# Patient Record
Sex: Female | Born: 1940 | Race: White | Hispanic: No | Smoking: Never smoker
Health system: Southern US, Community
[De-identification: ages and names within clinical notes are randomized; demographics above are authoritative.]

## PROBLEM LIST (undated history)

## (undated) DIAGNOSIS — I10 Essential (primary) hypertension: Secondary | ICD-10-CM

---

## 2008-07-21 ENCOUNTER — Emergency Department (HOSPITAL_COMMUNITY): Admission: EM | Admit: 2008-07-21 | Discharge: 2008-07-21 | Payer: Self-pay | Admitting: Emergency Medicine

## 2008-09-27 ENCOUNTER — Emergency Department (HOSPITAL_COMMUNITY): Admission: EM | Admit: 2008-09-27 | Discharge: 2008-09-27 | Payer: Self-pay | Admitting: Emergency Medicine

## 2009-03-01 ENCOUNTER — Emergency Department (HOSPITAL_COMMUNITY): Admission: EM | Admit: 2009-03-01 | Discharge: 2009-03-01 | Payer: Self-pay | Admitting: Emergency Medicine

## 2010-02-03 ENCOUNTER — Emergency Department (HOSPITAL_COMMUNITY): Admission: EM | Admit: 2010-02-03 | Discharge: 2010-02-04 | Payer: Self-pay | Admitting: Emergency Medicine

## 2010-06-14 ENCOUNTER — Ambulatory Visit (HOSPITAL_COMMUNITY): Admission: RE | Admit: 2010-06-14 | Discharge: 2010-06-14 | Payer: Self-pay | Admitting: Internal Medicine

## 2010-06-21 ENCOUNTER — Encounter: Payer: Self-pay | Admitting: Gastroenterology

## 2010-06-27 ENCOUNTER — Ambulatory Visit (HOSPITAL_COMMUNITY): Admission: RE | Admit: 2010-06-27 | Discharge: 2010-06-27 | Payer: Self-pay | Admitting: Gastroenterology

## 2010-06-27 ENCOUNTER — Ambulatory Visit: Payer: Self-pay | Admitting: Gastroenterology

## 2010-06-29 ENCOUNTER — Encounter (INDEPENDENT_AMBULATORY_CARE_PROVIDER_SITE_OTHER): Payer: Self-pay

## 2010-10-11 NOTE — Letter (Signed)
Summary: TRIAGE ORDER  TRIAGE ORDER   Imported By: Ave Filter 06/21/2010 16:19:59  _____________________________________________________________________  External Attachment:    Type:   Image     Comment:   External Document

## 2010-10-11 NOTE — Letter (Signed)
Summary: Plan of Care, Need to [facility]  [address]   [place], [place] [address]   Phone: [phone]  Fax: [phone]    June 29, 2010  Monica Alvarez [address] [place], [place]  [address] Nov 04, 1940   Dear Monica Alvarez,   We are writing this letter to inform you of treatment plans and/or discuss your plan of care.  We have tried several times to contact you; however, we have yet to reach you.  We ask that you please contact our office for follow-up on your gastrointestinal issues.  We can  be reached at [phone] to schedule an appointment, or to speak with someone regarding your health care needs.  Please do not neglect your health.   Sincerely,    Cloria Spring LPN  [facility] Ph: [phone]    Fax: [phone]

## 2010-11-28 LAB — DIFFERENTIAL
Basophils Relative: 1 % (ref 0–1)
Eosinophils Relative: 1 % (ref 0–5)
Monocytes Absolute: 0.6 10*3/uL (ref 0.1–1.0)
Monocytes Relative: 8 % (ref 3–12)
Neutro Abs: 3.1 10*3/uL (ref 1.7–7.7)

## 2010-11-28 LAB — BASIC METABOLIC PANEL
CO2: 24 mEq/L (ref 19–32)
Chloride: 110 mEq/L (ref 96–112)
GFR calc Af Amer: >60 mL/min (ref >60)
Glucose, Bld: 95 mg/dL (ref 70–99)
Potassium: 3.7 mEq/L (ref 3.5–5.1)
Sodium: 140 mEq/L (ref 135–145)

## 2010-11-28 LAB — CBC
HCT: 40.3 % (ref 36.0–46.0)
Hemoglobin: 13.9 g/dL (ref 12.0–15.0)
MCHC: 34.5 g/dL (ref 30.0–36.0)
MCV: 92.8 fL (ref 78.0–100.0)
RBC: 4.35 MIL/uL (ref 3.87–5.11)
RDW: 12.3 % (ref 11.5–15.5)

## 2011-06-13 LAB — STREP A DNA PROBE

## 2013-06-17 ENCOUNTER — Other Ambulatory Visit (HOSPITAL_COMMUNITY): Payer: Self-pay | Admitting: Family Medicine

## 2013-06-17 DIAGNOSIS — Z139 Encounter for screening, unspecified: Secondary | ICD-10-CM

## 2013-07-01 ENCOUNTER — Ambulatory Visit (HOSPITAL_COMMUNITY)
Admission: RE | Admit: 2013-07-01 | Discharge: 2013-07-01 | Disposition: A | Discharge status: Home / Self Care | Payer: Medicare Other | Source: Ambulatory Visit | Attending: Family Medicine | Admitting: Family Medicine

## 2013-07-01 DIAGNOSIS — Z139 Encounter for screening, unspecified: Secondary | ICD-10-CM

## 2013-07-01 DIAGNOSIS — Z1231 Encounter for screening mammogram for malignant neoplasm of breast: Secondary | ICD-10-CM | POA: Insufficient documentation

## 2015-07-19 ENCOUNTER — Emergency Department (HOSPITAL_COMMUNITY)
Admission: EM | Admit: 2015-07-19 | Discharge: 2015-07-19 | Disposition: A | Discharge status: Home / Self Care | Payer: Medicare Other | Attending: Emergency Medicine | Admitting: Emergency Medicine

## 2015-07-19 ENCOUNTER — Encounter (HOSPITAL_COMMUNITY): Payer: Self-pay | Admitting: Emergency Medicine

## 2015-07-19 ENCOUNTER — Emergency Department (HOSPITAL_COMMUNITY): Payer: Medicare Other

## 2015-07-19 DIAGNOSIS — I10 Essential (primary) hypertension: Secondary | ICD-10-CM | POA: Diagnosis not present

## 2015-07-19 DIAGNOSIS — R42 Dizziness and giddiness: Secondary | ICD-10-CM | POA: Insufficient documentation

## 2015-07-19 DIAGNOSIS — J209 Acute bronchitis, unspecified: Secondary | ICD-10-CM | POA: Insufficient documentation

## 2015-07-19 DIAGNOSIS — R05 Cough: Secondary | ICD-10-CM | POA: Diagnosis present

## 2015-07-19 DIAGNOSIS — J40 Bronchitis, not specified as acute or chronic: Secondary | ICD-10-CM

## 2015-07-19 HISTORY — DX: Essential (primary) hypertension: I10

## 2015-07-19 LAB — CBC WITH DIFFERENTIAL/PLATELET
Basophils Absolute: 0 10*3/uL (ref 0.0–0.1)
Basophils Relative: 0 %
EOS PCT: 0 %
Eosinophils Absolute: 0 10*3/uL (ref 0.0–0.7)
HEMATOCRIT: 37.4 % (ref 36.0–46.0)
Hemoglobin: 12.9 g/dL (ref 12.0–15.0)
LYMPHS ABS: 0.7 10*3/uL (ref 0.7–4.0)
LYMPHS PCT: 8 %
MCH: 31.9 pg (ref 26.0–34.0)
MCHC: 34.5 g/dL (ref 30.0–36.0)
MCV: 92.3 fL (ref 78.0–100.0)
MONO ABS: 0.6 10*3/uL (ref 0.1–1.0)
Monocytes Relative: 7 %
NEUTROS ABS: 7.7 10*3/uL (ref 1.7–7.7)
Neutrophils Relative %: 85 %
PLATELETS: 157 10*3/uL (ref 150–400)
RBC: 4.05 MIL/uL (ref 3.87–5.11)
RDW: 12.4 % (ref 11.5–15.5)
WBC: 9 10*3/uL (ref 4.0–10.5)

## 2015-07-19 LAB — COMPREHENSIVE METABOLIC PANEL
ALK PHOS: 71 U/L (ref 38–126)
ALT: 21 U/L (ref 14–54)
ANION GAP: 10 (ref 5–15)
AST: 26 U/L (ref 15–41)
Albumin: 3.8 g/dL (ref 3.5–5.0)
BUN: 16 mg/dL (ref 6–20)
CALCIUM: 8.9 mg/dL (ref 8.9–10.3)
CO2: 23 mmol/L (ref 22–32)
Chloride: 103 mmol/L (ref 101–111)
Creatinine, Ser: 0.81 mg/dL (ref 0.44–1.00)
GFR calc Af Amer: >60 mL/min (ref >60)
GFR calc non Af Amer: >60 mL/min (ref >60)
GLUCOSE: 148 mg/dL — AB (ref 65–99)
POTASSIUM: 3.3 mmol/L — AB (ref 3.5–5.1)
Sodium: 136 mmol/L (ref 135–145)
Total Bilirubin: 0.8 mg/dL (ref 0.3–1.2)
Total Protein: 7.2 g/dL (ref 6.5–8.1)

## 2015-07-19 MED ORDER — DOXYCYCLINE HYCLATE 100 MG PO CAPS: 100.0000 mg | ORAL_CAPSULE | Freq: Two times a day (BID) | ORAL | Status: AC

## 2015-07-19 MED ORDER — ACETAMINOPHEN 500 MG PO TABS
1000.0000 mg | ORAL_TABLET | Freq: Once | ORAL | Status: AC
  Administered 2015-07-19: 1000 mg via ORAL
  Filled 2015-07-19: qty 2

## 2015-07-19 MED ORDER — GUAIFENESIN 100 MG/5ML PO LIQD: 100.0000 mg | ORAL | Status: AC | PRN

## 2015-07-19 MED ORDER — SODIUM CHLORIDE 0.9 % IV BOLUS (SEPSIS)
1000.0000 mL | Freq: Once | INTRAVENOUS | Status: AC
  Administered 2015-07-19: 1000 mL via INTRAVENOUS

## 2015-07-19 NOTE — ED Provider Notes (Signed)
CSN: [id number]     Arrival date & time 07/19/15  0807 History  By signing my name below, I, Soijett Blue, attest that this documentation has been prepared under the direction and in the presence of Bethann Berkshire, MD. Electronically Signed: Soijett Blue, ED Scribe. 07/19/2015. 8:30 AM.   Chief Complaint  Patient presents with  . Shortness of Breath  . Cough      Patient is a 74 y.o. female presenting with shortness of breath and cough. The history is provided by the patient (the patient). No language interpreter was used.  Shortness of Breath Severity:  Moderate Onset quality:  Sudden Duration:  5 days Timing:  Intermittent Progression:  Worsening Chronicity:  New Relieved by:  Nothing Worsened by:  Nothing tried Ineffective treatments:  None tried Associated symptoms: cough, fever and headaches   Associated symptoms: no abdominal pain, no chest pain and no rash   Risk factors: no tobacco use   Cough Associated symptoms: chills, fever, headaches and shortness of breath   Associated symptoms: no chest pain, no eye discharge and no rash     LEIGH BLAS is a 74 y.o. female with a medical hx of HTN who presents to the Emergency Department complaining of SOB onset 4-5 days. Pt is having intermittent associated symptoms of non-productive cough, HA, lightheaded, fever, chills, and appetite change. She denies dizziness, and any other symptoms. Pt denies smoking at this time. Pt PCP is Dr. Sherryll Burger.    Past Medical History  Diagnosis Date  . Hypertension    History reviewed. No pertinent past surgical history. No family history on file. Social History  Substance Use Topics  . Smoking status: Never Smoker   . Smokeless tobacco: None  . Alcohol Use: No   OB History    No data available     Review of Systems  Constitutional: Positive for fever, chills and appetite change. Negative for fatigue.  HENT: Negative for congestion, ear discharge and sinus pressure.   Eyes: Negative  for discharge.  Respiratory: Positive for cough and shortness of breath.   Cardiovascular: Negative for chest pain.  Gastrointestinal: Negative for abdominal pain and diarrhea.  Genitourinary: Negative for frequency and hematuria.  Musculoskeletal: Negative for back pain.  Skin: Negative for rash.  Neurological: Positive for light-headedness and headaches. Negative for seizures.  Psychiatric/Behavioral: Negative for hallucinations.      Allergies  Review of patient's allergies indicates no known allergies.  Home Medications   Prior to Admission medications   Not on File   BP 120/62 mmHg  Pulse 92  Temp(Src) 99.1 F (37.3 C) (Oral)  Resp 21  Ht  (1.575 m)  Wt 184 lb (83.462 kg)  BMI 33.65 kg/m2  SpO2 92% Physical Exam  Constitutional: She is oriented to person, place, and time. She appears well-developed.  HENT:  Head: Normocephalic.  Eyes: Conjunctivae and EOM are normal. No scleral icterus.  Neck: Neck supple. No thyromegaly present.  Cardiovascular: Normal rate and regular rhythm.  Exam reveals no gallop and no friction rub.   No murmur heard. Pulmonary/Chest: No stridor. She has no wheezes. She has no rales. She exhibits no tenderness.  Abdominal: She exhibits no distension. There is no tenderness. There is no rebound.  Musculoskeletal: Normal range of motion. She exhibits no edema.  Lymphadenopathy:    She has no cervical adenopathy.  Neurological: She is oriented to person, place, and time. She exhibits normal muscle tone. Coordination normal.  Skin: No rash noted. No  erythema.  Psychiatric: She has a normal mood and affect. Her behavior is normal.    ED Course  Procedures (including critical care time) DIAGNOSTIC STUDIES: Oxygen Saturation is 92% on RA, low by my interpretation.    COORDINATION OF CARE: 8:29 AM Discussed treatment plan with pt at bedside which includes EKG, labs, and CXR and pt agreed to plan.    Labs Review Labs Reviewed   COMPREHENSIVE METABOLIC PANEL - Abnormal; Notable for the following:    Potassium 3.3 (*)    Glucose, Bld 148 (*)    All other components within normal limits  CBC WITH DIFFERENTIAL/PLATELET    Imaging Review Dg Chest 2 View  07/19/2015  CLINICAL DATA:  Cough, congestion, shortness of Breath EXAM: CHEST  2 VIEW COMPARISON:  07/21/2008 FINDINGS: Linear areas of scarring in the mid lungs bilaterally. This was present on prior study but has increased. No other confluent airspace opacities. No effusions. Heart is normal size. No acute bony abnormality. IMPRESSION: Areas of scarring bilaterally.  No acute findings. Electronically Signed   By: Charlett NoseKevin  Dover M.D.   On: 07/19/2015 09:33   I have personally reviewed and evaluated these images and lab results as part of my medical decision-making.   EKG Interpretation None      MDM   Final diagnoses:  None    Labs and x-rays unremarkable. Will treat respiratory infection with Levaquin and guaifenesin. Patient's follow-up with her family doctor next week   Bethann BerkshireJoseph Eowyn Tabone, MD 07/19/15 1257

## 2015-07-19 NOTE — ED Notes (Signed)
SOB for last 4-5 days.  Denies any pain.  C/o non-productive cough.

## 2015-07-19 NOTE — Discharge Instructions (Signed)
Follow up with your md next week if not improving °

## 2016-06-11 IMAGING — DX DG CHEST 2V
2 series · 2 of 2 positions shown · non-contrast
Comparison: 07/21/2008

CLINICAL DATA: Cough, congestion, shortness of Breath

EXAM:
CHEST  2 VIEW

[chest pa]
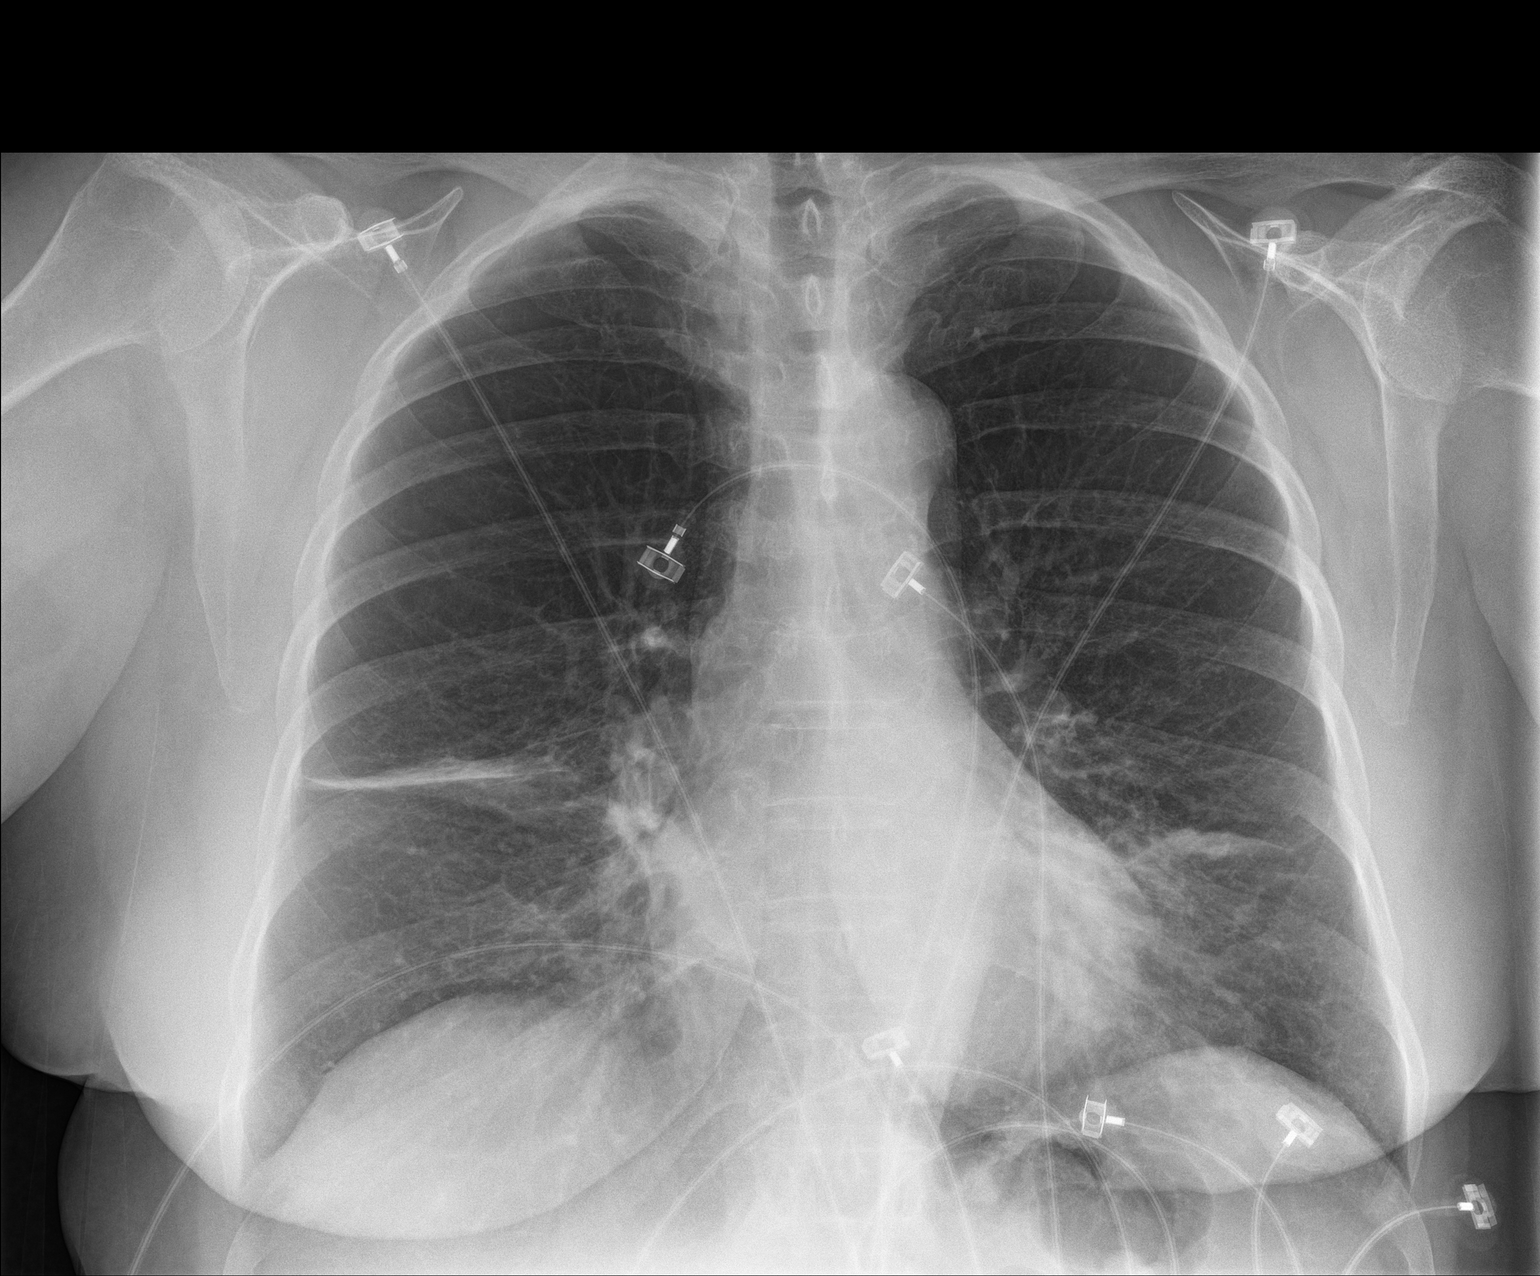

[chest lat]
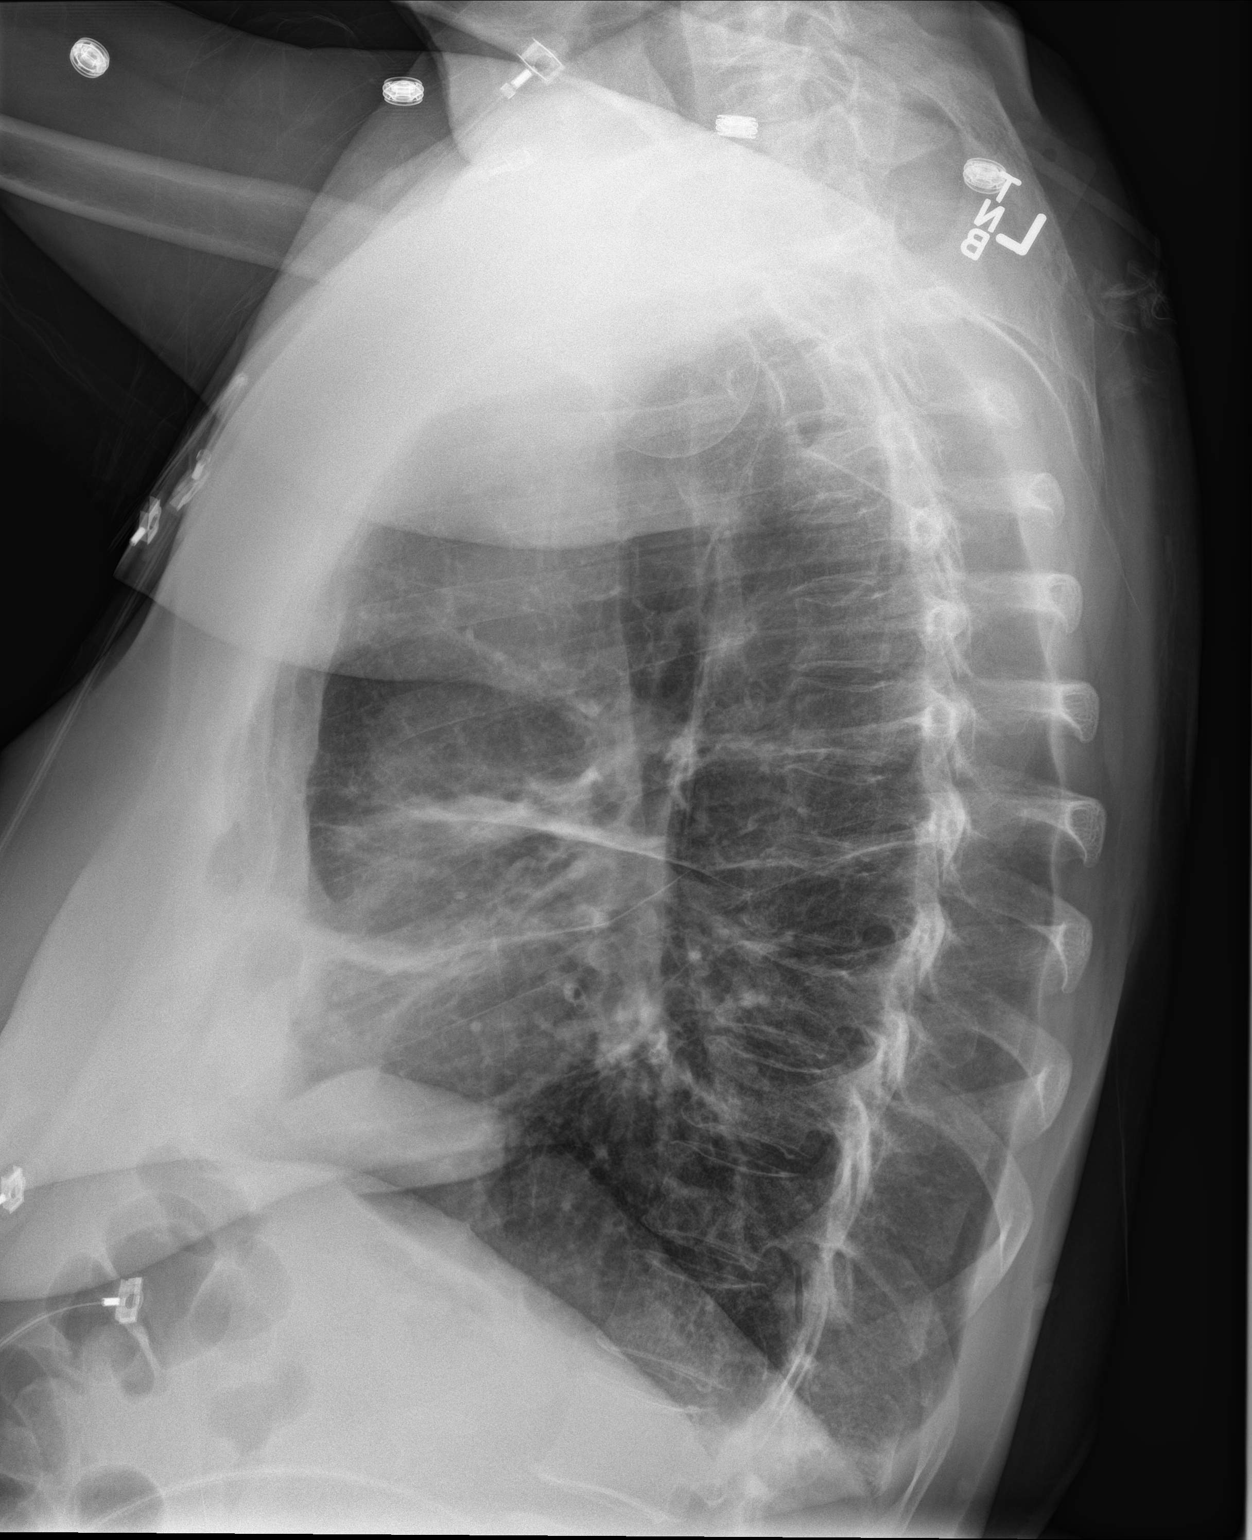

[2 of 2 positions shown; findings below may reference images not displayed]

FINDINGS: Linear areas of scarring in the mid lungs bilaterally. This was
present on prior study but has increased. No other confluent
airspace opacities. No effusions. Heart is normal size. No acute
bony abnormality.
IMPRESSION: Areas of scarring bilaterally.  No acute findings.

## 2019-09-23 DIAGNOSIS — E7800 Pure hypercholesterolemia, unspecified: Secondary | ICD-10-CM | POA: Diagnosis not present

## 2019-09-23 DIAGNOSIS — I10 Essential (primary) hypertension: Secondary | ICD-10-CM | POA: Diagnosis not present

## 2019-10-28 DIAGNOSIS — I10 Essential (primary) hypertension: Secondary | ICD-10-CM | POA: Diagnosis not present

## 2019-10-28 DIAGNOSIS — E7800 Pure hypercholesterolemia, unspecified: Secondary | ICD-10-CM | POA: Diagnosis not present

## 2019-11-18 DIAGNOSIS — I10 Essential (primary) hypertension: Secondary | ICD-10-CM | POA: Diagnosis not present

## 2019-11-18 DIAGNOSIS — E7800 Pure hypercholesterolemia, unspecified: Secondary | ICD-10-CM | POA: Diagnosis not present

## 2019-12-23 DIAGNOSIS — E7800 Pure hypercholesterolemia, unspecified: Secondary | ICD-10-CM | POA: Diagnosis not present

## 2019-12-23 DIAGNOSIS — I10 Essential (primary) hypertension: Secondary | ICD-10-CM | POA: Diagnosis not present

## 2019-12-24 DIAGNOSIS — Z7189 Other specified counseling: Secondary | ICD-10-CM | POA: Diagnosis not present

## 2019-12-24 DIAGNOSIS — R5383 Other fatigue: Secondary | ICD-10-CM | POA: Diagnosis not present

## 2019-12-24 DIAGNOSIS — Z1211 Encounter for screening for malignant neoplasm of colon: Secondary | ICD-10-CM | POA: Diagnosis not present

## 2019-12-24 DIAGNOSIS — E039 Hypothyroidism, unspecified: Secondary | ICD-10-CM | POA: Diagnosis not present

## 2019-12-24 DIAGNOSIS — Z0000 Encounter for general adult medical examination without abnormal findings: Secondary | ICD-10-CM | POA: Diagnosis not present

## 2019-12-24 DIAGNOSIS — Z79899 Other long term (current) drug therapy: Secondary | ICD-10-CM | POA: Diagnosis not present

## 2019-12-24 DIAGNOSIS — I10 Essential (primary) hypertension: Secondary | ICD-10-CM | POA: Diagnosis not present

## 2019-12-24 DIAGNOSIS — E7800 Pure hypercholesterolemia, unspecified: Secondary | ICD-10-CM | POA: Diagnosis not present

## 2019-12-24 DIAGNOSIS — E559 Vitamin D deficiency, unspecified: Secondary | ICD-10-CM | POA: Diagnosis not present

## 2019-12-24 DIAGNOSIS — Z299 Encounter for prophylactic measures, unspecified: Secondary | ICD-10-CM | POA: Diagnosis not present

## 2020-02-08 DIAGNOSIS — E7800 Pure hypercholesterolemia, unspecified: Secondary | ICD-10-CM | POA: Diagnosis not present

## 2020-02-08 DIAGNOSIS — I10 Essential (primary) hypertension: Secondary | ICD-10-CM | POA: Diagnosis not present

## 2020-03-10 DIAGNOSIS — I10 Essential (primary) hypertension: Secondary | ICD-10-CM | POA: Diagnosis not present

## 2020-03-10 DIAGNOSIS — E7800 Pure hypercholesterolemia, unspecified: Secondary | ICD-10-CM | POA: Diagnosis not present

## 2020-04-06 DIAGNOSIS — H524 Presbyopia: Secondary | ICD-10-CM | POA: Diagnosis not present

## 2020-04-09 DIAGNOSIS — R69 Illness, unspecified: Secondary | ICD-10-CM | POA: Diagnosis not present

## 2020-04-09 DIAGNOSIS — E7800 Pure hypercholesterolemia, unspecified: Secondary | ICD-10-CM | POA: Diagnosis not present

## 2020-04-09 DIAGNOSIS — I10 Essential (primary) hypertension: Secondary | ICD-10-CM | POA: Diagnosis not present

## 2020-04-20 DIAGNOSIS — R69 Illness, unspecified: Secondary | ICD-10-CM | POA: Diagnosis not present

## 2020-04-20 DIAGNOSIS — I10 Essential (primary) hypertension: Secondary | ICD-10-CM | POA: Diagnosis not present

## 2020-04-20 DIAGNOSIS — E7800 Pure hypercholesterolemia, unspecified: Secondary | ICD-10-CM | POA: Diagnosis not present

## 2020-05-13 DIAGNOSIS — H524 Presbyopia: Secondary | ICD-10-CM | POA: Diagnosis not present

## 2020-06-08 DIAGNOSIS — Z1231 Encounter for screening mammogram for malignant neoplasm of breast: Secondary | ICD-10-CM | POA: Diagnosis not present

## 2020-06-10 DIAGNOSIS — I10 Essential (primary) hypertension: Secondary | ICD-10-CM | POA: Diagnosis not present

## 2020-06-10 DIAGNOSIS — E7800 Pure hypercholesterolemia, unspecified: Secondary | ICD-10-CM | POA: Diagnosis not present

## 2020-06-10 DIAGNOSIS — R69 Illness, unspecified: Secondary | ICD-10-CM | POA: Diagnosis not present

## 2020-06-16 ENCOUNTER — Encounter: Payer: Self-pay | Admitting: Internal Medicine

## 2020-06-28 DIAGNOSIS — I10 Essential (primary) hypertension: Secondary | ICD-10-CM | POA: Diagnosis not present

## 2020-06-28 DIAGNOSIS — Z23 Encounter for immunization: Secondary | ICD-10-CM | POA: Diagnosis not present

## 2020-06-28 DIAGNOSIS — H9193 Unspecified hearing loss, bilateral: Secondary | ICD-10-CM | POA: Diagnosis not present

## 2020-06-28 DIAGNOSIS — E039 Hypothyroidism, unspecified: Secondary | ICD-10-CM | POA: Diagnosis not present

## 2020-06-28 DIAGNOSIS — Z6835 Body mass index (BMI) 35.0-35.9, adult: Secondary | ICD-10-CM | POA: Diagnosis not present

## 2020-06-28 DIAGNOSIS — Z299 Encounter for prophylactic measures, unspecified: Secondary | ICD-10-CM | POA: Diagnosis not present

## 2020-07-09 DIAGNOSIS — I10 Essential (primary) hypertension: Secondary | ICD-10-CM | POA: Diagnosis not present

## 2020-07-09 DIAGNOSIS — R69 Illness, unspecified: Secondary | ICD-10-CM | POA: Diagnosis not present

## 2020-07-09 DIAGNOSIS — E7800 Pure hypercholesterolemia, unspecified: Secondary | ICD-10-CM | POA: Diagnosis not present

## 2020-08-10 DIAGNOSIS — I10 Essential (primary) hypertension: Secondary | ICD-10-CM | POA: Diagnosis not present

## 2020-08-10 DIAGNOSIS — R69 Illness, unspecified: Secondary | ICD-10-CM | POA: Diagnosis not present

## 2020-08-10 DIAGNOSIS — E7800 Pure hypercholesterolemia, unspecified: Secondary | ICD-10-CM | POA: Diagnosis not present

## 2020-09-09 DIAGNOSIS — R69 Illness, unspecified: Secondary | ICD-10-CM | POA: Diagnosis not present

## 2020-09-09 DIAGNOSIS — E7800 Pure hypercholesterolemia, unspecified: Secondary | ICD-10-CM | POA: Diagnosis not present

## 2020-09-09 DIAGNOSIS — I10 Essential (primary) hypertension: Secondary | ICD-10-CM | POA: Diagnosis not present

## 2020-10-04 DIAGNOSIS — E039 Hypothyroidism, unspecified: Secondary | ICD-10-CM | POA: Diagnosis not present

## 2020-10-04 DIAGNOSIS — Z87891 Personal history of nicotine dependence: Secondary | ICD-10-CM | POA: Diagnosis not present

## 2020-10-04 DIAGNOSIS — I10 Essential (primary) hypertension: Secondary | ICD-10-CM | POA: Diagnosis not present

## 2020-10-04 DIAGNOSIS — Z6834 Body mass index (BMI) 34.0-34.9, adult: Secondary | ICD-10-CM | POA: Diagnosis not present

## 2020-10-04 DIAGNOSIS — Z299 Encounter for prophylactic measures, unspecified: Secondary | ICD-10-CM | POA: Diagnosis not present

## 2020-12-06 DIAGNOSIS — I10 Essential (primary) hypertension: Secondary | ICD-10-CM | POA: Diagnosis not present

## 2020-12-06 DIAGNOSIS — Z87891 Personal history of nicotine dependence: Secondary | ICD-10-CM | POA: Diagnosis not present

## 2020-12-06 DIAGNOSIS — F419 Anxiety disorder, unspecified: Secondary | ICD-10-CM | POA: Diagnosis not present

## 2020-12-06 DIAGNOSIS — G4700 Insomnia, unspecified: Secondary | ICD-10-CM | POA: Diagnosis not present

## 2020-12-06 DIAGNOSIS — E669 Obesity, unspecified: Secondary | ICD-10-CM | POA: Diagnosis not present

## 2020-12-06 DIAGNOSIS — E039 Hypothyroidism, unspecified: Secondary | ICD-10-CM | POA: Diagnosis not present

## 2020-12-06 DIAGNOSIS — Z008 Encounter for other general examination: Secondary | ICD-10-CM | POA: Diagnosis not present

## 2020-12-06 DIAGNOSIS — R69 Illness, unspecified: Secondary | ICD-10-CM | POA: Diagnosis not present

## 2020-12-06 DIAGNOSIS — R32 Unspecified urinary incontinence: Secondary | ICD-10-CM | POA: Diagnosis not present

## 2020-12-06 DIAGNOSIS — E785 Hyperlipidemia, unspecified: Secondary | ICD-10-CM | POA: Diagnosis not present

## 2020-12-06 DIAGNOSIS — Z6834 Body mass index (BMI) 34.0-34.9, adult: Secondary | ICD-10-CM | POA: Diagnosis not present

## 2020-12-30 DIAGNOSIS — Z1339 Encounter for screening examination for other mental health and behavioral disorders: Secondary | ICD-10-CM | POA: Diagnosis not present

## 2020-12-30 DIAGNOSIS — Z299 Encounter for prophylactic measures, unspecified: Secondary | ICD-10-CM | POA: Diagnosis not present

## 2020-12-30 DIAGNOSIS — Z6834 Body mass index (BMI) 34.0-34.9, adult: Secondary | ICD-10-CM | POA: Diagnosis not present

## 2020-12-30 DIAGNOSIS — Z0000 Encounter for general adult medical examination without abnormal findings: Secondary | ICD-10-CM | POA: Diagnosis not present

## 2020-12-30 DIAGNOSIS — Z79899 Other long term (current) drug therapy: Secondary | ICD-10-CM | POA: Diagnosis not present

## 2020-12-30 DIAGNOSIS — E7800 Pure hypercholesterolemia, unspecified: Secondary | ICD-10-CM | POA: Diagnosis not present

## 2020-12-30 DIAGNOSIS — Z1331 Encounter for screening for depression: Secondary | ICD-10-CM | POA: Diagnosis not present

## 2020-12-30 DIAGNOSIS — R5383 Other fatigue: Secondary | ICD-10-CM | POA: Diagnosis not present

## 2020-12-30 DIAGNOSIS — E559 Vitamin D deficiency, unspecified: Secondary | ICD-10-CM | POA: Diagnosis not present

## 2020-12-30 DIAGNOSIS — I10 Essential (primary) hypertension: Secondary | ICD-10-CM | POA: Diagnosis not present

## 2020-12-30 DIAGNOSIS — Z7189 Other specified counseling: Secondary | ICD-10-CM | POA: Diagnosis not present

## 2020-12-30 DIAGNOSIS — E039 Hypothyroidism, unspecified: Secondary | ICD-10-CM | POA: Diagnosis not present

## 2021-01-10 DIAGNOSIS — Z79899 Other long term (current) drug therapy: Secondary | ICD-10-CM | POA: Diagnosis not present

## 2021-01-10 DIAGNOSIS — E213 Hyperparathyroidism, unspecified: Secondary | ICD-10-CM | POA: Diagnosis not present

## 2021-01-10 DIAGNOSIS — E2839 Other primary ovarian failure: Secondary | ICD-10-CM | POA: Diagnosis not present

## 2021-04-05 DIAGNOSIS — Z6834 Body mass index (BMI) 34.0-34.9, adult: Secondary | ICD-10-CM | POA: Diagnosis not present

## 2021-04-05 DIAGNOSIS — I10 Essential (primary) hypertension: Secondary | ICD-10-CM | POA: Diagnosis not present

## 2021-04-05 DIAGNOSIS — Z299 Encounter for prophylactic measures, unspecified: Secondary | ICD-10-CM | POA: Diagnosis not present

## 2021-04-05 DIAGNOSIS — Z713 Dietary counseling and surveillance: Secondary | ICD-10-CM | POA: Diagnosis not present

## 2021-05-11 DIAGNOSIS — E7800 Pure hypercholesterolemia, unspecified: Secondary | ICD-10-CM | POA: Diagnosis not present

## 2021-05-11 DIAGNOSIS — I10 Essential (primary) hypertension: Secondary | ICD-10-CM | POA: Diagnosis not present

## 2021-07-13 DIAGNOSIS — I10 Essential (primary) hypertension: Secondary | ICD-10-CM | POA: Diagnosis not present

## 2021-07-13 DIAGNOSIS — Z6833 Body mass index (BMI) 33.0-33.9, adult: Secondary | ICD-10-CM | POA: Diagnosis not present

## 2021-07-13 DIAGNOSIS — Z713 Dietary counseling and surveillance: Secondary | ICD-10-CM | POA: Diagnosis not present

## 2021-07-13 DIAGNOSIS — Z23 Encounter for immunization: Secondary | ICD-10-CM | POA: Diagnosis not present

## 2021-07-13 DIAGNOSIS — Z87891 Personal history of nicotine dependence: Secondary | ICD-10-CM | POA: Diagnosis not present

## 2021-07-13 DIAGNOSIS — R253 Fasciculation: Secondary | ICD-10-CM | POA: Diagnosis not present

## 2021-07-13 DIAGNOSIS — Z299 Encounter for prophylactic measures, unspecified: Secondary | ICD-10-CM | POA: Diagnosis not present

## 2021-11-08 DIAGNOSIS — E7800 Pure hypercholesterolemia, unspecified: Secondary | ICD-10-CM | POA: Diagnosis not present

## 2021-11-08 DIAGNOSIS — I10 Essential (primary) hypertension: Secondary | ICD-10-CM | POA: Diagnosis not present

## 2021-12-26 DIAGNOSIS — E039 Hypothyroidism, unspecified: Secondary | ICD-10-CM | POA: Diagnosis not present

## 2021-12-26 DIAGNOSIS — G4700 Insomnia, unspecified: Secondary | ICD-10-CM | POA: Diagnosis not present

## 2021-12-26 DIAGNOSIS — Z6833 Body mass index (BMI) 33.0-33.9, adult: Secondary | ICD-10-CM | POA: Diagnosis not present

## 2021-12-26 DIAGNOSIS — E669 Obesity, unspecified: Secondary | ICD-10-CM | POA: Diagnosis not present

## 2021-12-26 DIAGNOSIS — Z811 Family history of alcohol abuse and dependence: Secondary | ICD-10-CM | POA: Diagnosis not present

## 2021-12-26 DIAGNOSIS — F419 Anxiety disorder, unspecified: Secondary | ICD-10-CM | POA: Diagnosis not present

## 2021-12-26 DIAGNOSIS — Z008 Encounter for other general examination: Secondary | ICD-10-CM | POA: Diagnosis not present

## 2021-12-26 DIAGNOSIS — R32 Unspecified urinary incontinence: Secondary | ICD-10-CM | POA: Diagnosis not present

## 2021-12-26 DIAGNOSIS — E785 Hyperlipidemia, unspecified: Secondary | ICD-10-CM | POA: Diagnosis not present

## 2021-12-26 DIAGNOSIS — Z809 Family history of malignant neoplasm, unspecified: Secondary | ICD-10-CM | POA: Diagnosis not present

## 2021-12-26 DIAGNOSIS — G4733 Obstructive sleep apnea (adult) (pediatric): Secondary | ICD-10-CM | POA: Diagnosis not present

## 2021-12-26 DIAGNOSIS — R69 Illness, unspecified: Secondary | ICD-10-CM | POA: Diagnosis not present

## 2021-12-26 DIAGNOSIS — I10 Essential (primary) hypertension: Secondary | ICD-10-CM | POA: Diagnosis not present

## 2022-01-05 DIAGNOSIS — Z1331 Encounter for screening for depression: Secondary | ICD-10-CM | POA: Diagnosis not present

## 2022-01-05 DIAGNOSIS — Z1339 Encounter for screening examination for other mental health and behavioral disorders: Secondary | ICD-10-CM | POA: Diagnosis not present

## 2022-01-05 DIAGNOSIS — E039 Hypothyroidism, unspecified: Secondary | ICD-10-CM | POA: Diagnosis not present

## 2022-01-05 DIAGNOSIS — E7800 Pure hypercholesterolemia, unspecified: Secondary | ICD-10-CM | POA: Diagnosis not present

## 2022-01-05 DIAGNOSIS — I10 Essential (primary) hypertension: Secondary | ICD-10-CM | POA: Diagnosis not present

## 2022-01-05 DIAGNOSIS — Z7189 Other specified counseling: Secondary | ICD-10-CM | POA: Diagnosis not present

## 2022-01-05 DIAGNOSIS — Z79899 Other long term (current) drug therapy: Secondary | ICD-10-CM | POA: Diagnosis not present

## 2022-01-05 DIAGNOSIS — Z299 Encounter for prophylactic measures, unspecified: Secondary | ICD-10-CM | POA: Diagnosis not present

## 2022-01-05 DIAGNOSIS — Z6833 Body mass index (BMI) 33.0-33.9, adult: Secondary | ICD-10-CM | POA: Diagnosis not present

## 2022-01-05 DIAGNOSIS — R5383 Other fatigue: Secondary | ICD-10-CM | POA: Diagnosis not present

## 2022-01-05 DIAGNOSIS — E559 Vitamin D deficiency, unspecified: Secondary | ICD-10-CM | POA: Diagnosis not present

## 2022-01-05 DIAGNOSIS — Z0000 Encounter for general adult medical examination without abnormal findings: Secondary | ICD-10-CM | POA: Diagnosis not present

## 2022-04-10 DIAGNOSIS — Z0000 Encounter for general adult medical examination without abnormal findings: Secondary | ICD-10-CM | POA: Diagnosis not present

## 2022-04-10 DIAGNOSIS — I10 Essential (primary) hypertension: Secondary | ICD-10-CM | POA: Diagnosis not present

## 2022-04-10 DIAGNOSIS — Z299 Encounter for prophylactic measures, unspecified: Secondary | ICD-10-CM | POA: Diagnosis not present

## 2022-04-10 DIAGNOSIS — Z87891 Personal history of nicotine dependence: Secondary | ICD-10-CM | POA: Diagnosis not present

## 2022-04-10 DIAGNOSIS — Z6834 Body mass index (BMI) 34.0-34.9, adult: Secondary | ICD-10-CM | POA: Diagnosis not present

## 2022-04-10 DIAGNOSIS — E039 Hypothyroidism, unspecified: Secondary | ICD-10-CM | POA: Diagnosis not present

## 2022-10-10 DIAGNOSIS — Z87891 Personal history of nicotine dependence: Secondary | ICD-10-CM | POA: Diagnosis not present

## 2022-10-10 DIAGNOSIS — Z299 Encounter for prophylactic measures, unspecified: Secondary | ICD-10-CM | POA: Diagnosis not present

## 2022-10-10 DIAGNOSIS — I10 Essential (primary) hypertension: Secondary | ICD-10-CM | POA: Diagnosis not present

## 2022-10-10 DIAGNOSIS — R42 Dizziness and giddiness: Secondary | ICD-10-CM | POA: Diagnosis not present

## 2022-10-10 DIAGNOSIS — Z6834 Body mass index (BMI) 34.0-34.9, adult: Secondary | ICD-10-CM | POA: Diagnosis not present

## 2022-10-10 DIAGNOSIS — E039 Hypothyroidism, unspecified: Secondary | ICD-10-CM | POA: Diagnosis not present

## 2022-10-24 DIAGNOSIS — R69 Illness, unspecified: Secondary | ICD-10-CM | POA: Diagnosis not present

## 2022-10-24 DIAGNOSIS — Z299 Encounter for prophylactic measures, unspecified: Secondary | ICD-10-CM | POA: Diagnosis not present

## 2022-10-24 DIAGNOSIS — Z6834 Body mass index (BMI) 34.0-34.9, adult: Secondary | ICD-10-CM | POA: Diagnosis not present

## 2022-10-24 DIAGNOSIS — Z87891 Personal history of nicotine dependence: Secondary | ICD-10-CM | POA: Diagnosis not present

## 2022-10-24 DIAGNOSIS — I10 Essential (primary) hypertension: Secondary | ICD-10-CM | POA: Diagnosis not present

## 2023-01-04 DIAGNOSIS — R32 Unspecified urinary incontinence: Secondary | ICD-10-CM | POA: Diagnosis not present

## 2023-01-04 DIAGNOSIS — Z818 Family history of other mental and behavioral disorders: Secondary | ICD-10-CM | POA: Diagnosis not present

## 2023-01-04 DIAGNOSIS — Z8249 Family history of ischemic heart disease and other diseases of the circulatory system: Secondary | ICD-10-CM | POA: Diagnosis not present

## 2023-01-04 DIAGNOSIS — F330 Major depressive disorder, recurrent, mild: Secondary | ICD-10-CM | POA: Diagnosis not present

## 2023-01-04 DIAGNOSIS — E213 Hyperparathyroidism, unspecified: Secondary | ICD-10-CM | POA: Diagnosis not present

## 2023-01-04 DIAGNOSIS — E039 Hypothyroidism, unspecified: Secondary | ICD-10-CM | POA: Diagnosis not present

## 2023-01-04 DIAGNOSIS — E785 Hyperlipidemia, unspecified: Secondary | ICD-10-CM | POA: Diagnosis not present

## 2023-01-04 DIAGNOSIS — I10 Essential (primary) hypertension: Secondary | ICD-10-CM | POA: Diagnosis not present

## 2023-01-04 DIAGNOSIS — Z87891 Personal history of nicotine dependence: Secondary | ICD-10-CM | POA: Diagnosis not present

## 2023-01-04 DIAGNOSIS — E669 Obesity, unspecified: Secondary | ICD-10-CM | POA: Diagnosis not present

## 2023-01-04 DIAGNOSIS — Z825 Family history of asthma and other chronic lower respiratory diseases: Secondary | ICD-10-CM | POA: Diagnosis not present

## 2023-01-04 DIAGNOSIS — Z809 Family history of malignant neoplasm, unspecified: Secondary | ICD-10-CM | POA: Diagnosis not present

## 2023-02-12 DIAGNOSIS — Z1339 Encounter for screening examination for other mental health and behavioral disorders: Secondary | ICD-10-CM | POA: Diagnosis not present

## 2023-02-12 DIAGNOSIS — Z299 Encounter for prophylactic measures, unspecified: Secondary | ICD-10-CM | POA: Diagnosis not present

## 2023-02-12 DIAGNOSIS — E7800 Pure hypercholesterolemia, unspecified: Secondary | ICD-10-CM | POA: Diagnosis not present

## 2023-02-12 DIAGNOSIS — Z1331 Encounter for screening for depression: Secondary | ICD-10-CM | POA: Diagnosis not present

## 2023-02-12 DIAGNOSIS — E559 Vitamin D deficiency, unspecified: Secondary | ICD-10-CM | POA: Diagnosis not present

## 2023-02-12 DIAGNOSIS — E039 Hypothyroidism, unspecified: Secondary | ICD-10-CM | POA: Diagnosis not present

## 2023-02-12 DIAGNOSIS — Z79899 Other long term (current) drug therapy: Secondary | ICD-10-CM | POA: Diagnosis not present

## 2023-02-12 DIAGNOSIS — R5383 Other fatigue: Secondary | ICD-10-CM | POA: Diagnosis not present

## 2023-02-12 DIAGNOSIS — Z7189 Other specified counseling: Secondary | ICD-10-CM | POA: Diagnosis not present

## 2023-02-12 DIAGNOSIS — I10 Essential (primary) hypertension: Secondary | ICD-10-CM | POA: Diagnosis not present

## 2023-02-12 DIAGNOSIS — Z0000 Encounter for general adult medical examination without abnormal findings: Secondary | ICD-10-CM | POA: Diagnosis not present

## 2023-07-11 DIAGNOSIS — H2513 Age-related nuclear cataract, bilateral: Secondary | ICD-10-CM | POA: Diagnosis not present

## 2024-02-26 DIAGNOSIS — E559 Vitamin D deficiency, unspecified: Secondary | ICD-10-CM | POA: Diagnosis not present

## 2024-02-26 DIAGNOSIS — E7800 Pure hypercholesterolemia, unspecified: Secondary | ICD-10-CM | POA: Diagnosis not present

## 2024-02-26 DIAGNOSIS — Z79899 Other long term (current) drug therapy: Secondary | ICD-10-CM | POA: Diagnosis not present

## 2024-02-26 DIAGNOSIS — Z0000 Encounter for general adult medical examination without abnormal findings: Secondary | ICD-10-CM | POA: Diagnosis not present

## 2024-02-26 DIAGNOSIS — E039 Hypothyroidism, unspecified: Secondary | ICD-10-CM | POA: Diagnosis not present

## 2024-04-22 DIAGNOSIS — H52223 Regular astigmatism, bilateral: Secondary | ICD-10-CM | POA: Diagnosis not present

## 2024-04-22 DIAGNOSIS — H524 Presbyopia: Secondary | ICD-10-CM | POA: Diagnosis not present

## 2024-04-29 DIAGNOSIS — R918 Other nonspecific abnormal finding of lung field: Secondary | ICD-10-CM | POA: Diagnosis not present

## 2024-04-29 DIAGNOSIS — R0602 Shortness of breath: Secondary | ICD-10-CM | POA: Diagnosis not present

## 2024-04-29 DIAGNOSIS — R062 Wheezing: Secondary | ICD-10-CM | POA: Diagnosis not present

## 2024-05-05 DIAGNOSIS — R0602 Shortness of breath: Secondary | ICD-10-CM | POA: Diagnosis not present
